# Patient Record
Sex: Female | Born: 1995 | Race: White | Hispanic: No | Marital: Single | Smoking: Never smoker
Health system: Southern US, Community
[De-identification: ages and names within clinical notes are randomized; demographics above are authoritative.]

## PROBLEM LIST (undated history)

## (undated) DIAGNOSIS — J45909 Unspecified asthma, uncomplicated: Secondary | ICD-10-CM

## (undated) DIAGNOSIS — N390 Urinary tract infection, site not specified: Secondary | ICD-10-CM

## (undated) DIAGNOSIS — J309 Allergic rhinitis, unspecified: Secondary | ICD-10-CM

## (undated) HISTORY — DX: Unspecified asthma, uncomplicated: J45.909

## (undated) HISTORY — DX: Allergic rhinitis, unspecified: J30.9

## (undated) HISTORY — DX: Urinary tract infection, site not specified: N39.0

---
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
## 2017-01-22 IMAGING — CR DG FOOT COMPLETE 3+V*R*
1 series · 3 of 3 positions shown · non-contrast
Comparison: None.

CLINICAL DATA: Fall

EXAM:
RIGHT FOOT COMPLETE - 3+ VIEW

[Series 1: x foot ap right · 0.14mm/px · 3 of 3 slices shown]
[im 1/3]
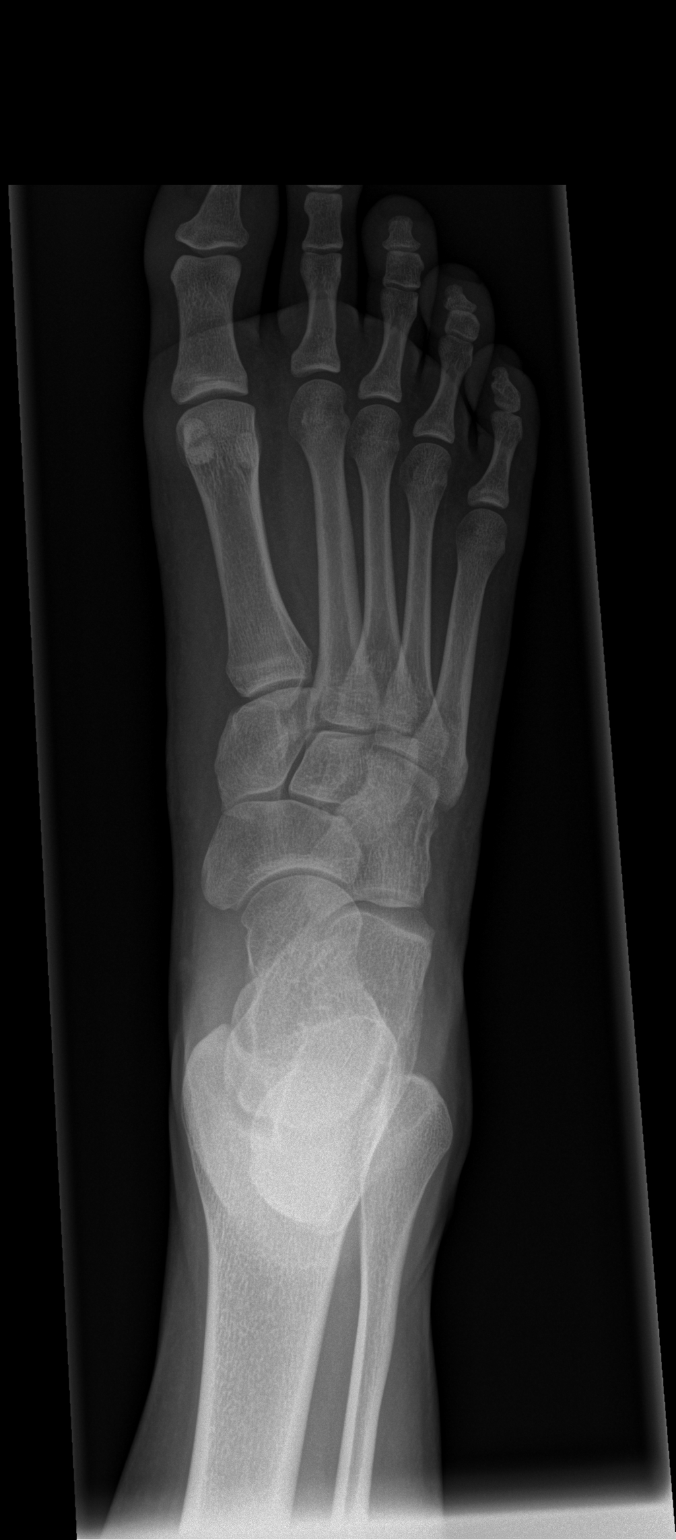
[im 2/3]
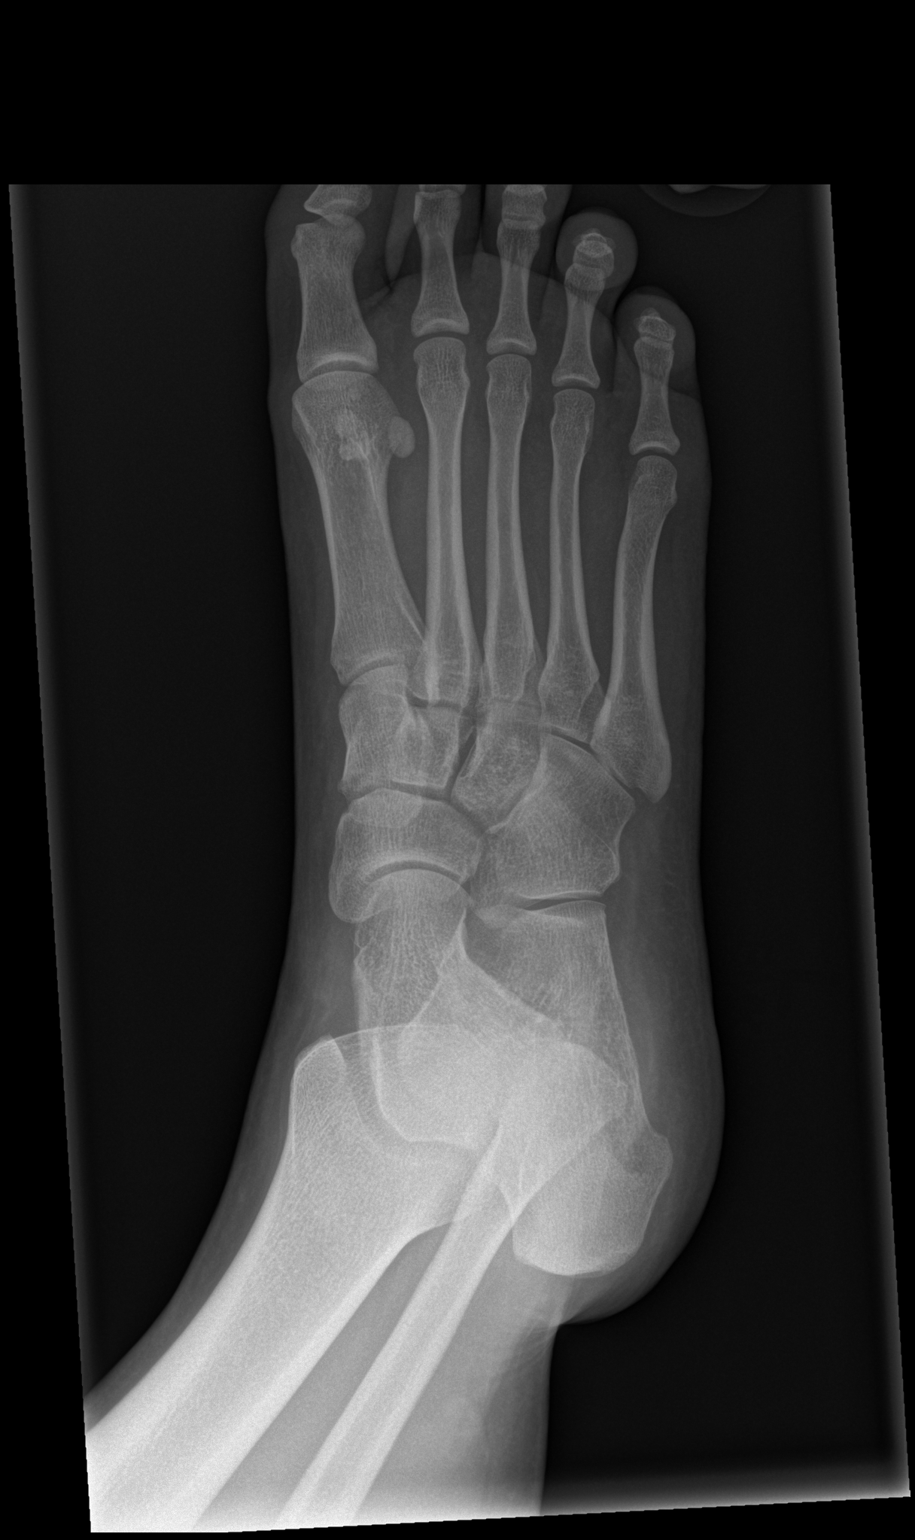
[im 3/3]
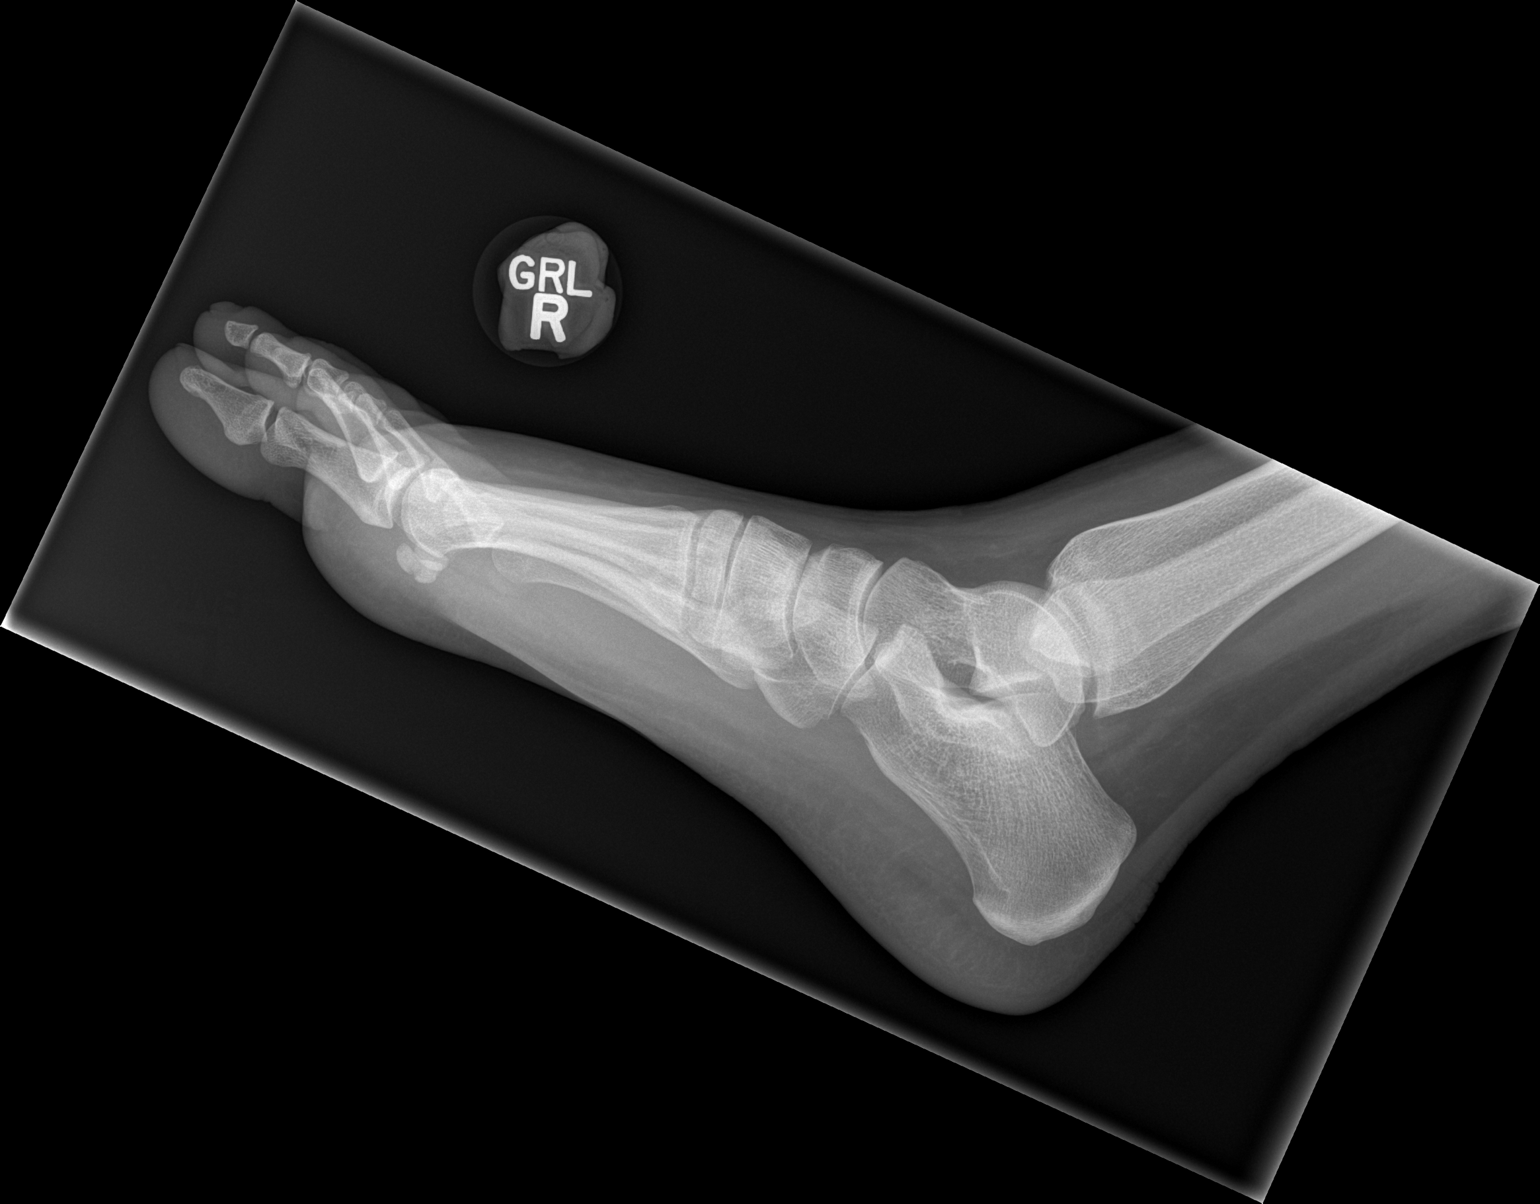

[3 of 3 positions shown; findings below may reference images not displayed]

FINDINGS: No acute fracture. No dislocation.  Unremarkable soft tissues.
IMPRESSION: No acute bony pathology.
